# Patient Record
Sex: Female | Born: 1985 | Hispanic: Yes | Marital: Single | Smoking: Never smoker
Health system: Southern US, Community
[De-identification: ages and names within clinical notes are randomized; demographics above are authoritative.]

## PROBLEM LIST (undated history)

## (undated) DIAGNOSIS — G43909 Migraine, unspecified, not intractable, without status migrainosus: Secondary | ICD-10-CM

---

## 2020-11-22 ENCOUNTER — Encounter (HOSPITAL_COMMUNITY): Payer: Self-pay | Admitting: Emergency Medicine

## 2020-11-22 ENCOUNTER — Other Ambulatory Visit: Payer: Self-pay

## 2020-11-22 ENCOUNTER — Emergency Department (HOSPITAL_COMMUNITY)
Admission: EM | Admit: 2020-11-22 | Discharge: 2020-11-23 | Disposition: A | Discharge status: Home / Self Care | Payer: Self-pay | Attending: Emergency Medicine | Admitting: Emergency Medicine

## 2020-11-22 ENCOUNTER — Emergency Department (HOSPITAL_COMMUNITY): Payer: Self-pay

## 2020-11-22 DIAGNOSIS — M25511 Pain in right shoulder: Secondary | ICD-10-CM | POA: Insufficient documentation

## 2020-11-22 DIAGNOSIS — M25512 Pain in left shoulder: Secondary | ICD-10-CM | POA: Insufficient documentation

## 2020-11-22 DIAGNOSIS — M542 Cervicalgia: Secondary | ICD-10-CM | POA: Insufficient documentation

## 2020-11-22 DIAGNOSIS — R110 Nausea: Secondary | ICD-10-CM | POA: Insufficient documentation

## 2020-11-22 DIAGNOSIS — R519 Headache, unspecified: Secondary | ICD-10-CM | POA: Insufficient documentation

## 2020-11-22 DIAGNOSIS — R200 Anesthesia of skin: Secondary | ICD-10-CM | POA: Insufficient documentation

## 2020-11-22 HISTORY — DX: Migraine, unspecified, not intractable, without status migrainosus: G43.909

## 2020-11-22 LAB — CBC WITH DIFFERENTIAL/PLATELET
Abs Immature Granulocytes: 0.03 10*3/uL (ref 0.00–0.07)
Basophils Absolute: 0 10*3/uL (ref 0.0–0.1)
Basophils Relative: 0 %
Eosinophils Absolute: 0.1 10*3/uL (ref 0.0–0.5)
Eosinophils Relative: 1 %
HCT: 36.1 % (ref 36.0–46.0)
Hemoglobin: 12.4 g/dL (ref 12.0–15.0)
Immature Granulocytes: 0 %
Lymphocytes Relative: 28 %
Lymphs Abs: 1.9 10*3/uL (ref 0.7–4.0)
MCH: 29.7 pg (ref 26.0–34.0)
MCHC: 34.3 g/dL (ref 30.0–36.0)
MCV: 86.4 fL (ref 80.0–100.0)
Monocytes Absolute: 0.3 10*3/uL (ref 0.1–1.0)
Monocytes Relative: 5 %
Neutro Abs: 4.4 10*3/uL (ref 1.7–7.7)
Neutrophils Relative %: 66 %
Platelets: 313 10*3/uL (ref 150–400)
RBC: 4.18 MIL/uL (ref 3.87–5.11)
RDW: 13.4 % (ref 11.5–15.5)
WBC: 6.8 10*3/uL (ref 4.0–10.5)
nRBC: 0 % (ref 0.0–0.2)

## 2020-11-22 LAB — BASIC METABOLIC PANEL
Anion gap: 7 (ref 5–15)
BUN: 7 mg/dL (ref 6–20)
CO2: 24 mmol/L (ref 22–32)
Calcium: 8.4 mg/dL — ABNORMAL LOW (ref 8.9–10.3)
Chloride: 105 mmol/L (ref 98–111)
Creatinine, Ser: 0.58 mg/dL (ref 0.44–1.00)
GFR, Estimated: >60 mL/min (ref >60)
Glucose, Bld: 97 mg/dL (ref 70–99)
Potassium: 3.4 mmol/L — ABNORMAL LOW (ref 3.5–5.1)
Sodium: 136 mmol/L (ref 135–145)

## 2020-11-22 LAB — I-STAT BETA HCG BLOOD, ED (MC, WL, AP ONLY): I-stat hCG, quantitative: <5 m[IU]/mL (ref <5)

## 2020-11-22 IMAGING — CT CT ANGIO NECK
1 of 11 series · 5 of 33 positions shown · IV contrast (OMNI 350)
Comparison: None.
COMPARISON: None.

Addendum:
CLINICAL DATA: Stroke/TIA. Concern for subarachnoid hemorrhage
versus dissection.

EXAM:
CT ANGIOGRAPHY HEAD AND NECK
TECHNIQUE: Multidetector CT imaging of the head and neck was performed using
the standard protocol during bolus administration of intravenous
contrast. Multiplanar CT image reconstructions and MIPs were
obtained to evaluate the vascular anatomy. Carotid stenosis
measurements (when applicable) are obtained utilizing NASCET
criteria, using the distal internal carotid diameter as the
denominator.
CONTRAST:  75mL OMNIPAQUE IOHEXOL 350 MG/ML SOLN

[Series 11: cta neck axial · axial · 0.39mm/px · z∈[+1078,+1282]mm · 5 of 317 slices shown]
[im 53/317  soft-tissue]
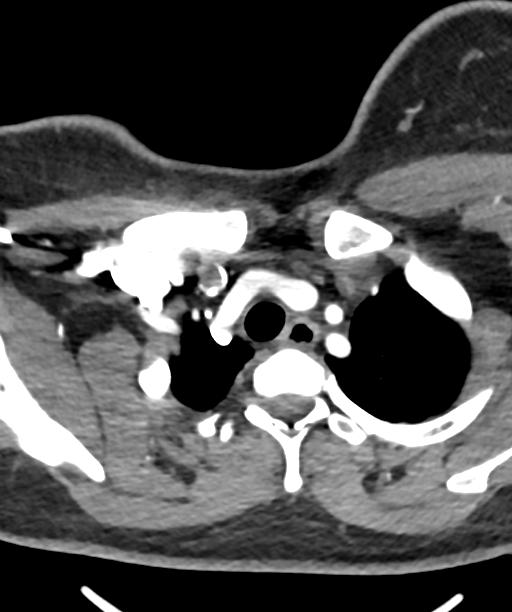
[im 106/317  bone]
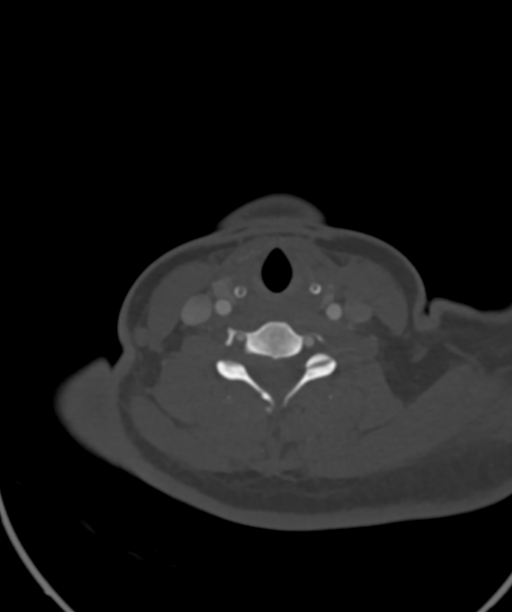
[im 159/317  soft-tissue]
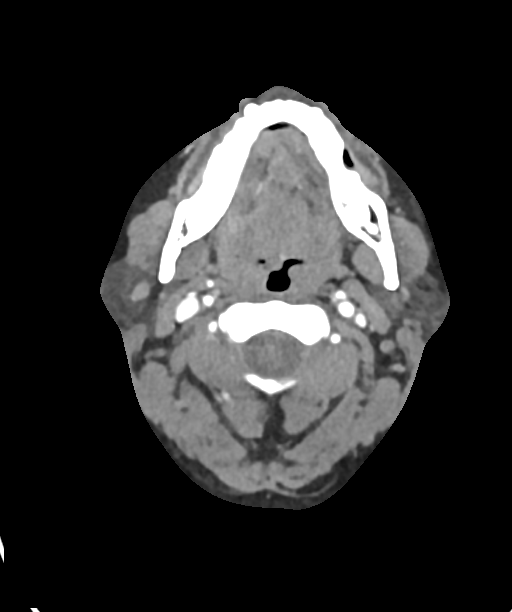
[im 211/317  bone]
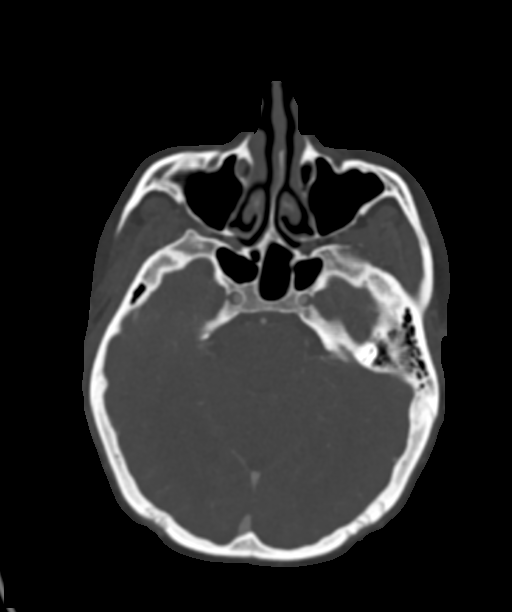
[im 264/317  soft-tissue]
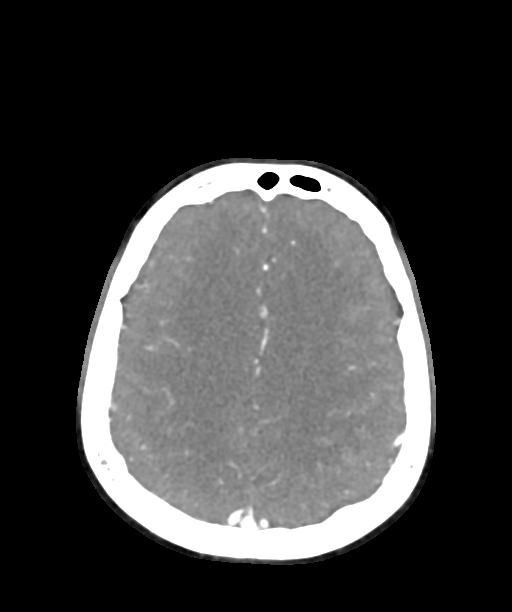

[5 of 33 positions shown; findings below may reference images not displayed]

FINDINGS: CT HEAD FINDINGS

Brain: No evidence of acute infarction, hemorrhage, hydrocephalus,
extra-axial collection or mass lesion/mass effect.

Vascular: See below.

Skull: No acute fracture. Nonspecific scalp lesion at the vertex.

Sinuses/Orbits: Mild paranasal sinus mucosal thickening. No acute
orbital findings.

Review of the MIP images confirms the above findings

CTA NECK FINDINGS

Aortic arch: Great vessel origins are patent.

Right carotid system: No evidence of dissection, stenosis (50% or
greater) or occlusion.

Left carotid system: No evidence of dissection, stenosis (50% or
greater) or occlusion.

Vertebral arteries: Codominant. No evidence of dissection, stenosis
(50% or greater) or occlusion.

Skeleton: No acute abnormality.

Other neck: No acute abnormality.

Upper chest: Mild dependent atelectasis. Otherwise, visualized lung
apices are clear.

Review of the MIP images confirms the above findings

CTA HEAD FINDINGS

Anterior circulation: Bilateral intracranial ICAs, MCAs, and ACAs
are patent without proximal flow limiting stenosis. Limited
evaluation of the distal arteries due to venous timing. Small (1-2
mm) superiorly directed outpouching rising from the right
paraophthalmic ICA (series 13, image 9) which is near ophthalmic
artery.

Posterior circulation: Mild narrowing of the proximal left
intradural vertebral artery, favored to be secondary to
atherosclerosis. Mild stenosis of the basilar artery. Mildly
dysplastic appearance of the basilar tip without focal aneurysm.
Bilateral PCAs are patent without proximal flow limiting stenosis.
Evaluation of the distal PCAs is limited due to venous
contamination.

Venous sinuses: As permitted by contrast timing, patent. Arachnoid
granulation in the distal left transverse sinus. Small left
transverse and sigmoid sinuses.

Review of the MIP images confirms the above findings
IMPRESSION: CT head:

No evidence of acute intracranial abnormality.

CTA head:

1. No large vessel occlusion.
2. Mild stenosis of the proximal intradural left vertebral artery
and the basilar artery, most likely secondary to atherosclerosis.
3. Small (1-2 mm) superiorly directed outpouching rising from the
right paraopthalmic ICA, which is near the ophthalmic artery and may
represent an infundibulum versus small aneurysm.

CTA Neck:

No significant (greater than 50%) stenosis or specific evidence of
dissection.

ADDENDUM:
On further review, there is luminal irregularity of the proximal
right vertebral artery (see series 13, image 84; series 11, image
241) with approximately 40-50% stenosis. Given the patient's
reported acute neck pain, this finding may represent a small focal
dissection.

Findings discussed with AKUTO, PA at [DATE] via telephone.

*** End of Addendum ***
FINDINGS: CT HEAD FINDINGS

Brain: No evidence of acute infarction, hemorrhage, hydrocephalus,
extra-axial collection or mass lesion/mass effect.

Vascular: See below.

Skull: No acute fracture. Nonspecific scalp lesion at the vertex.

Sinuses/Orbits: Mild paranasal sinus mucosal thickening. No acute
orbital findings.

Review of the MIP images confirms the above findings

CTA NECK FINDINGS

Aortic arch: Great vessel origins are patent.

Right carotid system: No evidence of dissection, stenosis (50% or
greater) or occlusion.

Left carotid system: No evidence of dissection, stenosis (50% or
greater) or occlusion.

Vertebral arteries: Codominant. No evidence of dissection, stenosis
(50% or greater) or occlusion.

Skeleton: No acute abnormality.

Other neck: No acute abnormality.

Upper chest: Mild dependent atelectasis. Otherwise, visualized lung
apices are clear.

Review of the MIP images confirms the above findings

CTA HEAD FINDINGS

Anterior circulation: Bilateral intracranial ICAs, MCAs, and ACAs
are patent without proximal flow limiting stenosis. Limited
evaluation of the distal arteries due to venous timing. Small (1-2
mm) superiorly directed outpouching rising from the right
paraophthalmic ICA (series 13, image 9) which is near ophthalmic
artery.

Posterior circulation: Mild narrowing of the proximal left
intradural vertebral artery, favored to be secondary to
atherosclerosis. Mild stenosis of the basilar artery. Mildly
dysplastic appearance of the basilar tip without focal aneurysm.
Bilateral PCAs are patent without proximal flow limiting stenosis.
Evaluation of the distal PCAs is limited due to venous
contamination.

Venous sinuses: As permitted by contrast timing, patent. Arachnoid
granulation in the distal left transverse sinus. Small left
transverse and sigmoid sinuses.

Review of the MIP images confirms the above findings
IMPRESSION: CT head:

No evidence of acute intracranial abnormality.

CTA head:

1. No large vessel occlusion.
2. Mild stenosis of the proximal intradural left vertebral artery
and the basilar artery, most likely secondary to atherosclerosis.
3. Small (1-2 mm) superiorly directed outpouching rising from the
right paraopthalmic ICA, which is near the ophthalmic artery and may
represent an infundibulum versus small aneurysm.

CTA Neck:

No significant (greater than 50%) stenosis or specific evidence of
dissection.

## 2020-11-22 MED ORDER — IOHEXOL 350 MG/ML SOLN
75.0000 mL | Freq: Once | INTRAVENOUS | Status: AC | PRN
  Administered 2020-11-22: 75 mL via INTRAVENOUS

## 2020-11-22 MED ORDER — ASPIRIN EC 81 MG PO TBEC
81.0000 mg | DELAYED_RELEASE_TABLET | Freq: Once | ORAL | Status: AC
  Administered 2020-11-22: 81 mg via ORAL
  Filled 2020-11-22: qty 1

## 2020-11-22 MED ORDER — KETOROLAC TROMETHAMINE 15 MG/ML IJ SOLN
15.0000 mg | Freq: Once | INTRAMUSCULAR | Status: AC
  Administered 2020-11-22: 15 mg via INTRAVENOUS
  Filled 2020-11-22: qty 1

## 2020-11-22 MED ORDER — DIPHENHYDRAMINE HCL 50 MG/ML IJ SOLN
50.0000 mg | Freq: Once | INTRAMUSCULAR | Status: AC
  Administered 2020-11-22: 50 mg via INTRAVENOUS
  Filled 2020-11-22: qty 1

## 2020-11-22 MED ORDER — SODIUM CHLORIDE 0.9 % IV BOLUS
1000.0000 mL | Freq: Once | INTRAVENOUS | Status: AC
  Administered 2020-11-22: 1000 mL via INTRAVENOUS

## 2020-11-22 MED ORDER — METOCLOPRAMIDE HCL 5 MG/ML IJ SOLN
10.0000 mg | Freq: Once | INTRAMUSCULAR | Status: AC
  Administered 2020-11-22: 10 mg via INTRAVENOUS
  Filled 2020-11-22: qty 2

## 2020-11-22 MED ORDER — DEXAMETHASONE SODIUM PHOSPHATE 10 MG/ML IJ SOLN
10.0000 mg | Freq: Once | INTRAMUSCULAR | Status: AC
  Administered 2020-11-22: 10 mg via INTRAVENOUS
  Filled 2020-11-22: qty 1

## 2020-11-22 MED ORDER — CLOPIDOGREL BISULFATE 75 MG PO TABS
75.0000 mg | ORAL_TABLET | Freq: Once | ORAL | Status: AC
  Administered 2020-11-22: 75 mg via ORAL
  Filled 2020-11-22: qty 1

## 2020-11-22 NOTE — ED Notes (Signed)
Patient transported to CT 

## 2020-11-22 NOTE — ED Notes (Signed)
Pt ambulated to the restroom w/o incident. 

## 2020-11-22 NOTE — ED Triage Notes (Signed)
C/o L sided headache with shooting pain to R side of head, neck pain, nausea, and vomiting since Monday.  Pain worse for 2 days.  History of migraines but states this feels different.  Stratus used for triage.

## 2020-11-22 NOTE — ED Provider Notes (Signed)
Care of the patient received from [facility].  Please see his note for full HPI.  In short, 35 year old female with a history of migraines presented with left-sided headache radiating into her neck, onset gradual in nature, progressively worsening throughout the week.  Unrelieved with Tylenol.  She had endorsed some numbness to the hands bilaterally as well.  No focal neurodeficits on prior provider's exam, as well as my exam.  There was concern for possible dissection and thus a CTA of the head and neck was ordered.  Signout received pending imaging.   Patient had received a migraine cocktail from prior provider.  On my reevaluation, she states it has slightly improved but she still continues to have a headache.  Added on Toradol and Decadron.  CTA of the head and neck with small outpouching from the right paraophthalmic ICA which could be an infundibulum or a small aneurysm.  CTA of the neck was overall reassuring.  However at 10:03 PM, did receive a call from radiologist Dr. Mervyn Skeeters who had reviewed the patient's imaging again, and did note an irregularity of the proximal right vertebral artery with approximately 40 to 50% stenosis.  There was concern for small focal dissection.  I discussed the case with Dr. Derry Lory with neurology.  He recommends MRI of the brain without contrast as well as MR V and MRA to rule out.  He also recommends aspirin and Plavix which was ordered.  Signed out care to [facility] who will oversee her imaging and dispo accordingly   Case discussed w/ Dr. Fredderick Phenix who is agreeable to the above plan and disposition   Results for orders placed or performed during the hospital encounter of 11/22/20  Basic metabolic panel  Result Value Ref Range   Sodium 136 135 - 145 mmol/L   Potassium 3.4 (L) 3.5 - 5.1 mmol/L   Chloride 105 98 - 111 mmol/L   CO2 24 22 - 32 mmol/L   Glucose, Bld 97 70 - 99 mg/dL   BUN 7 6 - 20 mg/dL   Creatinine, Ser 7.09 0.44 - 1.00  mg/dL   Calcium 8.4 (L) 8.9 - 10.3 mg/dL   GFR, Estimated >62 >83 mL/min   Anion gap 7 5 - 15  CBC with Differential  Result Value Ref Range   WBC 6.8 4.0 - 10.5 K/uL   RBC 4.18 3.87 - 5.11 MIL/uL   Hemoglobin 12.4 12.0 - 15.0 g/dL   HCT 66.2 94.7 - 65.4 %   MCV 86.4 80.0 - 100.0 fL   MCH 29.7 26.0 - 34.0 pg   MCHC 34.3 30.0 - 36.0 g/dL   RDW 65.0 35.4 - 65.6 %   Platelets 313 150 - 400 K/uL   nRBC 0.0 0.0 - 0.2 %   Neutrophils Relative % 66 %   Neutro Abs 4.4 1.7 - 7.7 K/uL   Lymphocytes Relative 28 %   Lymphs Abs 1.9 0.7 - 4.0 K/uL   Monocytes Relative 5 %   Monocytes Absolute 0.3 0.1 - 1.0 K/uL   Eosinophils Relative 1 %   Eosinophils Absolute 0.1 0.0 - 0.5 K/uL   Basophils Relative 0 %   Basophils Absolute 0.0 0.0 - 0.1 K/uL   Immature Granulocytes 0 %   Abs Immature Granulocytes 0.03 0.00 - 0.07 K/uL  I-Stat Beta hCG blood, ED ([facility], [facility], [facility] only)  Result Value Ref Range   I-stat hCG, quantitative <5.0 <5 mIU/mL   Comment 3  CT Angio Head W or Wo Contrast  Addendum Date: 11/22/2020   ADDENDUM REPORT: 11/22/2020 22:08 ADDENDUM: On further review, there is luminal irregularity of the proximal right vertebral artery (see series 13, image 84; series 11, image 241) with approximately 40-50% stenosis. Given the patient's reported acute neck pain, this finding may represent a small focal dissection. Findings discussed with Trudee Grip, PA at 10:03 PM via telephone. Electronically Signed   By: Feliberto Harts MD   On: 11/22/2020 22:08   Result Date: 11/22/2020 CLINICAL DATA:  Stroke/TIA. Concern for subarachnoid hemorrhage versus dissection. EXAM: CT ANGIOGRAPHY HEAD AND NECK TECHNIQUE: Multidetector CT imaging of the head and neck was performed using the standard protocol during bolus administration of intravenous contrast. Multiplanar CT image reconstructions and MIPs were obtained to evaluate the vascular anatomy. Carotid stenosis measurements (when applicable) are  obtained utilizing NASCET criteria, using the distal internal carotid diameter as the denominator. CONTRAST:  76mL OMNIPAQUE IOHEXOL 350 MG/ML SOLN COMPARISON:  None. FINDINGS: CT HEAD FINDINGS Brain: No evidence of acute infarction, hemorrhage, hydrocephalus, extra-axial collection or mass lesion/mass effect. Vascular: See below. Skull: No acute fracture. Nonspecific scalp lesion at the vertex. Sinuses/Orbits: Mild paranasal sinus mucosal thickening. No acute orbital findings. Review of the MIP images confirms the above findings CTA NECK FINDINGS Aortic arch: Great vessel origins are patent. Right carotid system: No evidence of dissection, stenosis (50% or greater) or occlusion. Left carotid system: No evidence of dissection, stenosis (50% or greater) or occlusion. Vertebral arteries: Codominant. No evidence of dissection, stenosis (50% or greater) or occlusion. Skeleton: No acute abnormality. Other neck: No acute abnormality. Upper chest: Mild dependent atelectasis. Otherwise, visualized lung apices are clear. Review of the MIP images confirms the above findings CTA HEAD FINDINGS Anterior circulation: Bilateral intracranial ICAs, MCAs, and ACAs are patent without proximal flow limiting stenosis. Limited evaluation of the distal arteries due to venous timing. Small (1-2 mm) superiorly directed outpouching rising from the right paraophthalmic ICA (series 13, image 9) which is near ophthalmic artery. Posterior circulation: Mild narrowing of the proximal left intradural vertebral artery, favored to be secondary to atherosclerosis. Mild stenosis of the basilar artery. Mildly dysplastic appearance of the basilar tip without focal aneurysm. Bilateral PCAs are patent without proximal flow limiting stenosis. Evaluation of the distal PCAs is limited due to venous contamination. Venous sinuses: As permitted by contrast timing, patent. Arachnoid granulation in the distal left transverse sinus. Small left transverse and  sigmoid sinuses. Review of the MIP images confirms the above findings IMPRESSION: CT head: No evidence of acute intracranial abnormality. CTA head: 1. No large vessel occlusion. 2. 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 3. Small (1-2 mm) superiorly directed outpouching rising from the right paraopthalmic ICA, which is near the ophthalmic artery and may represent an infundibulum versus small aneurysm. CTA Neck: No significant (greater than 50%) stenosis or specific evidence of dissection. Electronically Signed: By: Feliberto Harts MD On: 11/22/2020 20:02   CT Angio Neck W and/or Wo Contrast  Addendum Date: 11/22/2020   ADDENDUM REPORT: 11/22/2020 22:08 ADDENDUM: On further review, there is luminal irregularity of the proximal right vertebral artery (see series 13, image 84; series 11, image 241) with approximately 40-50% stenosis. Given the patient's reported acute neck pain, this finding may represent a small focal dissection. Findings discussed with Trudee Grip, PA at 10:03 PM via telephone. Electronically Signed   By: Feliberto Harts MD  On: 11/22/2020 22:08   Result Date: 11/22/2020 CLINICAL DATA:  Stroke/TIA. Concern for subarachnoid hemorrhage versus dissection. EXAM: CT ANGIOGRAPHY HEAD AND NECK TECHNIQUE: Multidetector CT imaging of the head and neck was performed using the standard protocol during bolus administration of intravenous contrast. Multiplanar CT image reconstructions and MIPs were obtained to evaluate the vascular anatomy. Carotid stenosis measurements (when applicable) are obtained utilizing NASCET criteria, using the distal internal carotid diameter as the denominator. CONTRAST:  67mL OMNIPAQUE IOHEXOL 350 MG/ML SOLN COMPARISON:  None. FINDINGS: CT HEAD FINDINGS Brain: No evidence of acute infarction, hemorrhage, hydrocephalus, extra-axial collection or mass lesion/mass effect. Vascular: See  below. Skull: No acute fracture. Nonspecific scalp lesion at the vertex. Sinuses/Orbits: Mild paranasal sinus mucosal thickening. No acute orbital findings. Review of the MIP images confirms the above findings CTA NECK FINDINGS Aortic arch: Great vessel origins are patent. Right carotid system: No evidence of dissection, stenosis (50% or greater) or occlusion. Left carotid system: No evidence of dissection, stenosis (50% or greater) or occlusion. Vertebral arteries: Codominant. No evidence of dissection, stenosis (50% or greater) or occlusion. Skeleton: No acute abnormality. Other neck: No acute abnormality. Upper chest: Mild dependent atelectasis. Otherwise, visualized lung apices are clear. Review of the MIP images confirms the above findings CTA HEAD FINDINGS Anterior circulation: Bilateral intracranial ICAs, MCAs, and ACAs are patent without proximal flow limiting stenosis. Limited evaluation of the distal arteries due to venous timing. Small (1-2 mm) superiorly directed outpouching rising from the right paraophthalmic ICA (series 13, image 9) which is near ophthalmic artery. Posterior circulation: Mild narrowing of the proximal left intradural vertebral artery, favored to be secondary to atherosclerosis. Mild stenosis of the basilar artery. Mildly dysplastic appearance of the basilar tip without focal aneurysm. Bilateral PCAs are patent without proximal flow limiting stenosis. Evaluation of the distal PCAs is limited due to venous contamination. Venous sinuses: As permitted by contrast timing, patent. Arachnoid granulation in the distal left transverse sinus. Small left transverse and sigmoid sinuses. Review of the MIP images confirms the above findings IMPRESSION: CT head: No evidence of acute intracranial abnormality. CTA head: 1. No large vessel occlusion. 2. 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 3. Small (1-2 mm)  superiorly directed outpouching rising from the right paraopthalmic ICA, which is near the ophthalmic artery and may represent an infundibulum versus small aneurysm. CTA Neck: No significant (greater than 50%) stenosis or specific evidence of dissection. Electronically Signed: By: Feliberto Harts MD On: 11/22/2020 20:02      Mare Ferrari, PA-C 11/22/20 2316    Rolan Bucco, MD 11/22/20 2332

## 2020-11-22 NOTE — ED Provider Notes (Signed)
[facility] EMERGENCY DEPARTMENT Provider Note   CSN: [id number] Arrival date & time: 11/22/20  1336     History Chief Complaint  Patient presents with   Headache    Joy Moore is a 35 y.o. female with a reported history of migraine headaches.  Patient presents emergency department with complaint of headache.  Patient reports that her headache started on Monday.  Headache was intermittent.  Pain became constant and more intense.  Pain is located to the left side of her head and radiates into the left side of her neck.  Patient reports the pain is worse when straining, standing, or bending.  Patient reports no relief with Tylenol.  Patient rates pain 10/10 on the pain scale.  Patient reports that this pain is different from previous migraine headaches he has had.  Patient endorses nausea last night and this morning however none at present.  Patient endorses numbness to bilateral hands.   Patient denies any fevers, chills, visual disturbance, vomiting, neck stiffness, weakness, facial asymmetry, slurred speech.    Patient denies any recent falls or injury.  Patient denies any illicit drug use.    Interpreter was used to conduct this interview.   Headache Associated symptoms: nausea, neck pain and numbness   Associated symptoms: no abdominal pain, no back pain, no dizziness, no fever, no neck stiffness, no seizures, no vomiting and no weakness       Past Medical History:  Diagnosis Date   Migraine     There are no problems to display for this patient.   History reviewed. No pertinent surgical history.   OB History   No obstetric history on file.     No family history on file.  Social History   Tobacco Use   Smoking status: Never   Smokeless tobacco: Never  Substance Use Topics   Alcohol use: Never   Drug use: Never    Home Medications Prior to Admission medications   Not on File    Allergies    Patient has no allergy information on  record.  Review of Systems   Review of Systems  Constitutional:  Negative for chills and fever.  HENT:  Negative for facial swelling.   Eyes:  Negative for visual disturbance.  Respiratory:  Negative for shortness of breath.   Cardiovascular:  Negative for chest pain.  Gastrointestinal:  Positive for nausea. Negative for abdominal pain and vomiting.  Genitourinary:  Negative for difficulty urinating.  Musculoskeletal:  Positive for neck pain. Negative for back pain and neck stiffness.  Skin:  Negative for color change and rash.  Neurological:  Positive for numbness and headaches. Negative for dizziness, tremors, seizures, syncope, facial asymmetry, speech difficulty, weakness and light-headedness.  Psychiatric/Behavioral:  Negative for confusion.    Physical Exam Updated Vital Signs BP (!) 151/95 (BP Location: Left Arm)   Pulse 84   Temp 98.7 F (37.1 C) (Oral)   Resp 18   LMP 10/25/2020   SpO2 100%   Physical Exam Vitals and nursing note reviewed.  Constitutional:      General: She is not in acute distress.    Appearance: She is not ill-appearing, toxic-appearing or diaphoretic.     Comments: Appears uncomfortable d/t complaints of pain   HENT:     Head: Normocephalic and atraumatic. No raccoon eyes, Battle's sign, abrasion, contusion, masses, right periorbital erythema, left periorbital erythema or laceration.     Jaw: No trismus or pain on movement.  Mouth/Throat:     Pharynx: Oropharynx is clear. Uvula midline. No pharyngeal swelling, oropharyngeal exudate, posterior oropharyngeal erythema or uvula swelling.  Eyes:     General: No scleral icterus.       Right eye: No discharge.        Left eye: No discharge.     Extraocular Movements: Extraocular movements intact.     Conjunctiva/sclera: Conjunctivae normal.     Pupils: Pupils are equal, round, and reactive to light.  Neck:     Comments: Tenderness to left sternocleidomastoid And bilateral trapezius muscles.    Cardiovascular:     Rate and Rhythm: Normal rate.  Pulmonary:     Effort: Pulmonary effort is normal. No respiratory distress.  Abdominal:     Palpations: Abdomen is soft.     Tenderness: There is no abdominal tenderness.  Musculoskeletal:     Cervical back: Neck supple. No edema, erythema, signs of trauma, rigidity, torticollis or crepitus. Muscular tenderness present. No pain with movement or spinous process tenderness. Decreased range of motion (2ndary d/t to complaints of pain).     Right lower leg: No swelling, tenderness or bony tenderness. No edema.     Left lower leg: No swelling or tenderness. No edema.  Skin:    General: Skin is warm and dry.     Coloration: Skin is not cyanotic or pale.  Neurological:     General: No focal deficit present.     Mental Status: She is alert.     GCS: GCS eye subscore is 4. GCS verbal subscore is 5. GCS motor subscore is 6.     Cranial Nerves: No cranial nerve deficit or facial asymmetry.     Sensory: Sensation is intact.     Motor: No weakness, tremor, seizure activity or pronator drift.     Coordination: Finger-Nose-Finger Test normal.     Gait: Gait is intact.     Comments: CN II-XII intact, equal grip strength, +5 strength to bilateral upper and lower extremities, sensation to light touch intact to bilateral upper and lower extremities  Psychiatric:        Behavior: Behavior is cooperative.    ED Results / Procedures / Treatments   Labs (all labs ordered are listed, but only abnormal results are displayed) Labs Reviewed  BASIC METABOLIC PANEL  CBC WITH DIFFERENTIAL/PLATELET  I-STAT BETA HCG BLOOD, ED ([facility], [facility], [facility] ONLY)    EKG None  Radiology No results found.  Procedures Procedures   Medications Ordered in ED Medications  metoCLOPramide (REGLAN) injection 10 mg (has no administration in time range)  diphenhydrAMINE (BENADRYL) injection 50 mg (has no administration in time range)  sodium chloride 0.9 % bolus 1,000 mL (has  no administration in time range)    ED Course  I have reviewed the triage vital signs and the nursing notes.  Pertinent labs & imaging results that were available during my care of the patient were reviewed by me and considered in my medical decision making (see chart for details).    MDM Rules/Calculators/A&P                          Alert 35 year old female, appears uncomfortable to report the pain, in no acute distress.  Presents emerged part with a chief complaint of headache.  Headache associated left side of her head and radiates into the left side of her neck.  Patient endorses numbness to bilateral hands.  On physical exam patient has  no focal neurological deficits.  Patient does have tenderness to left sternocleidomastoid muscle and bilateral trapezius muscles.  Pain with neck range of motion.  Decreased cervical range of motion due to complaints of pain.  Will obtain CTA head and neck to evaluate for subarachnoid hemorrhage or dissection.  We will give patient migraine cocktail.  Patient care transferred to PA Belaya at the end of my shift. Patient presentation, ED course, and plan of care discussed with review of all pertinent labs and imaging. Please see his/her note for further details regarding further ED course and disposition.   Final Clinical Impression(s) / ED Diagnoses Final diagnoses:  None    Rx / DC Orders ED Discharge Orders     None        Haskel Schroeder, PA-C 11/22/20 1722    Horton, Clabe Seal, DO 11/26/20 1343

## 2020-11-23 ENCOUNTER — Emergency Department (HOSPITAL_COMMUNITY): Payer: Self-pay

## 2020-11-23 IMAGING — MR MR MRV HEAD WO/W CM
2 series · 16 of 16 positions shown · IV contrast (Yes GAD)
Comparison: Prior brain MRI from earlier the same day as well as
CTA from [DATE].

CLINICAL DATA: Initial evaluation for acute headache.

EXAM:
MR VENOGRAM HEAD WITHOUT AND WITH CONTRAST
TECHNIQUE: Angiographic images of the intracranial venous structures were
acquired using MRV technique without and with intravenous contrast.
CONTRAST:  7mL GADAVIST GADOBUTROL 1 MMOL/ML IV SOLN

[Series 1100: multiplanar reconstruction (mpr) · sagittal · 0.9mm · 0.47mm/px · 7 of 204 slices shown (1 of 2)]
[im 1/204]
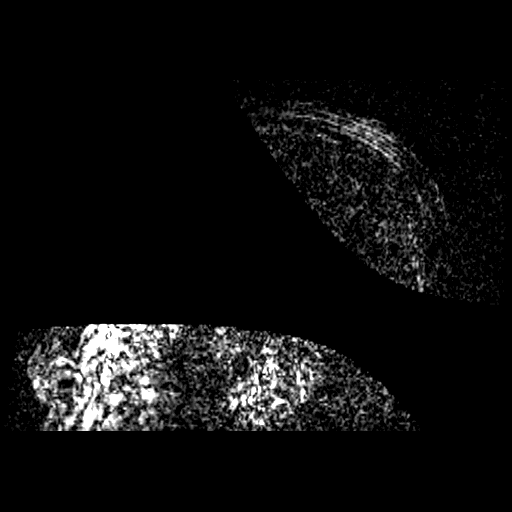
[im 34/204]
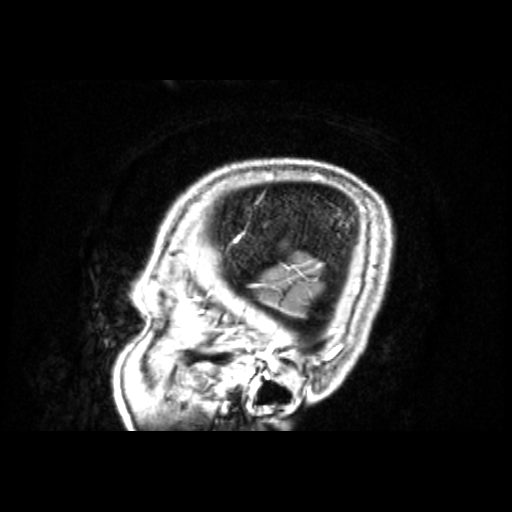
[im 68/204]
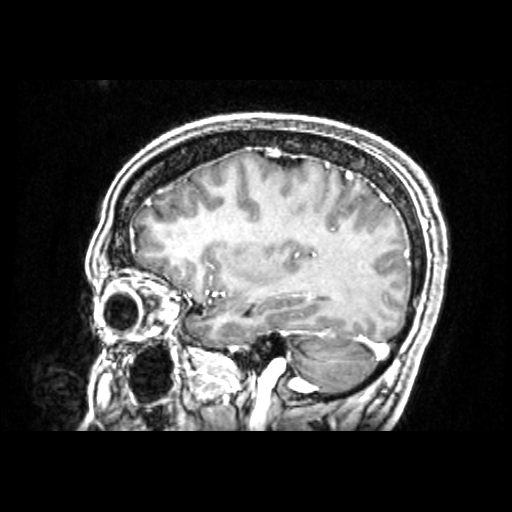
[im 102/204]
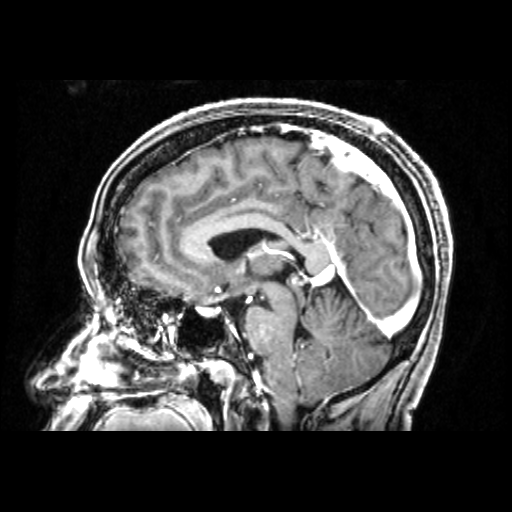
[im 136/204]
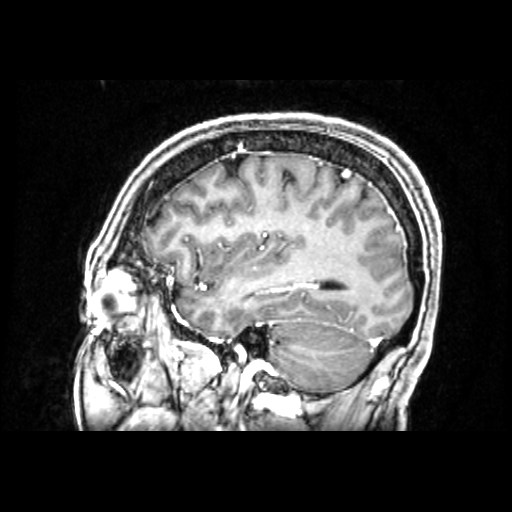
[im 170/204]
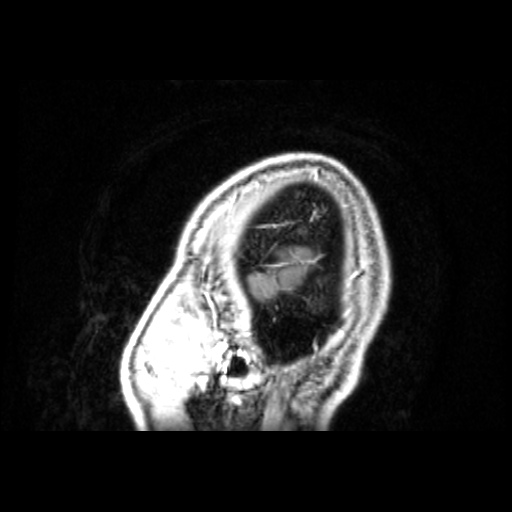
[im 204/204]
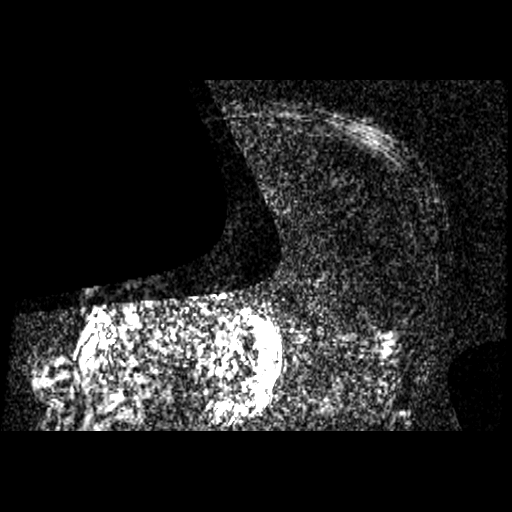

[Series 1101: multiplanar reconstruction (mpr) · coronal · 0.9mm · 0.47mm/px · 9 of 255 slices shown (2 of 2)]
[im 1/255]
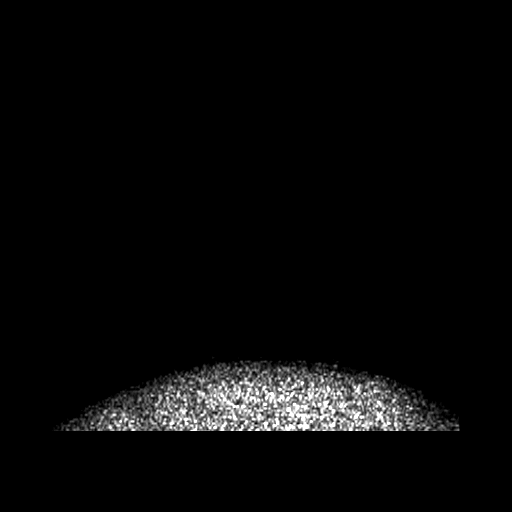
[im 32/255]
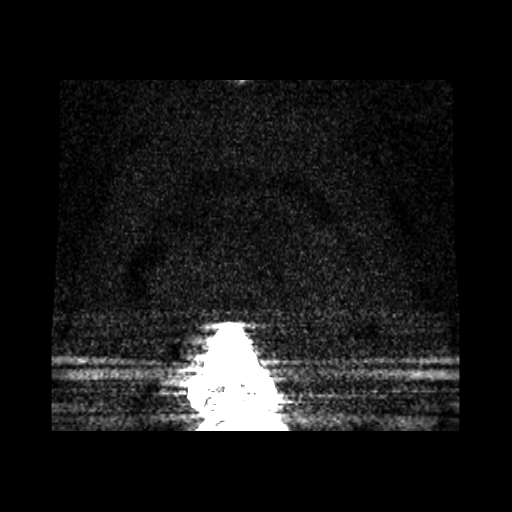
[im 64/255]
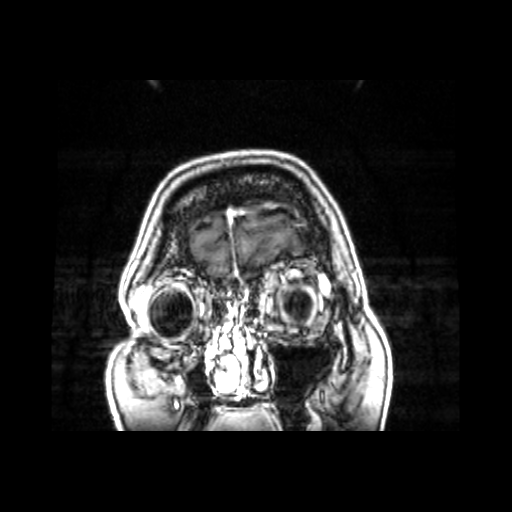
[im 96/255]
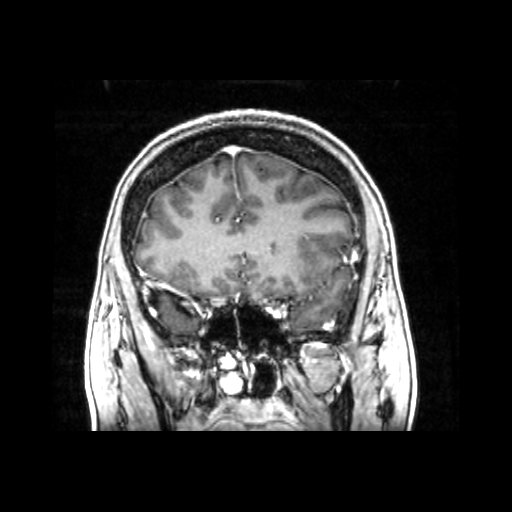
[im 128/255]
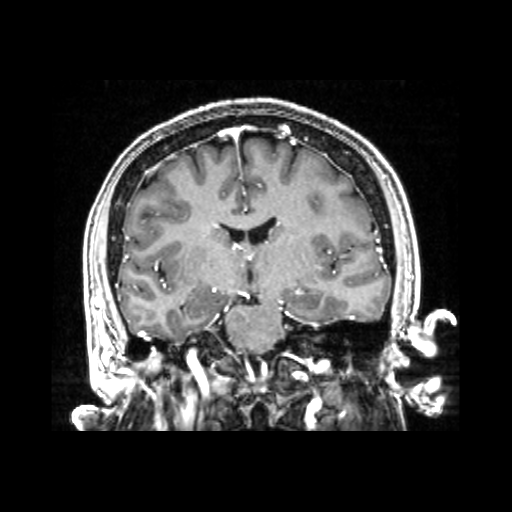
[im 159/255]
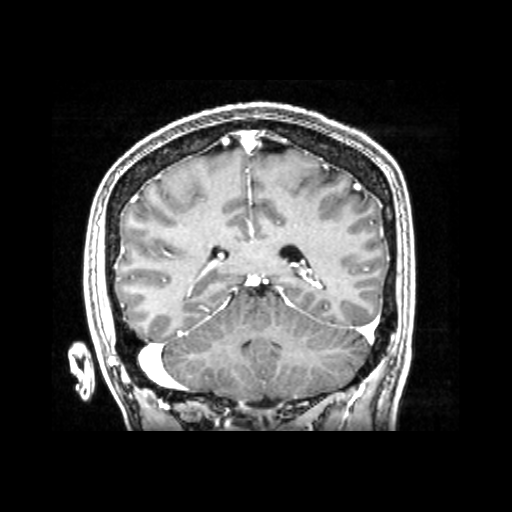
[im 191/255]
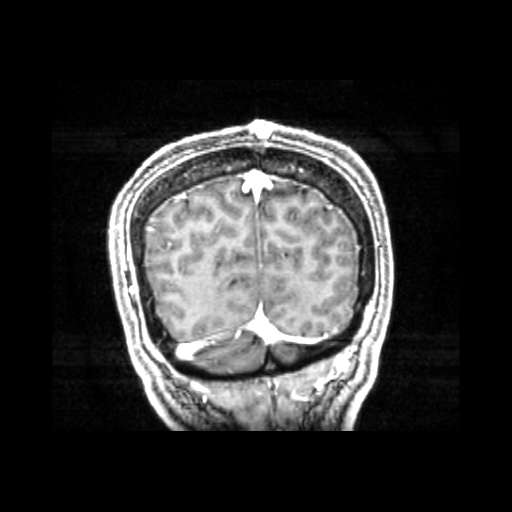
[im 223/255]
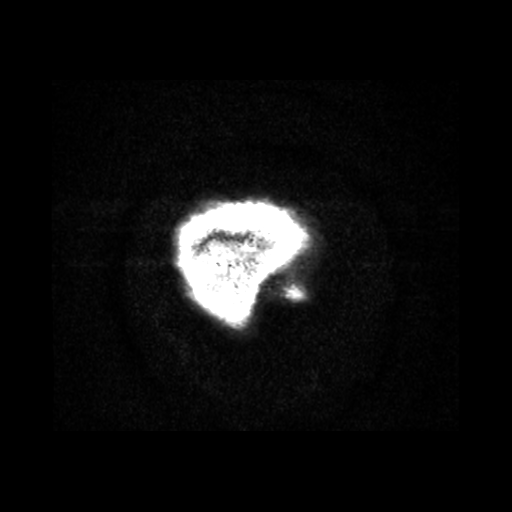
[im 255/255]
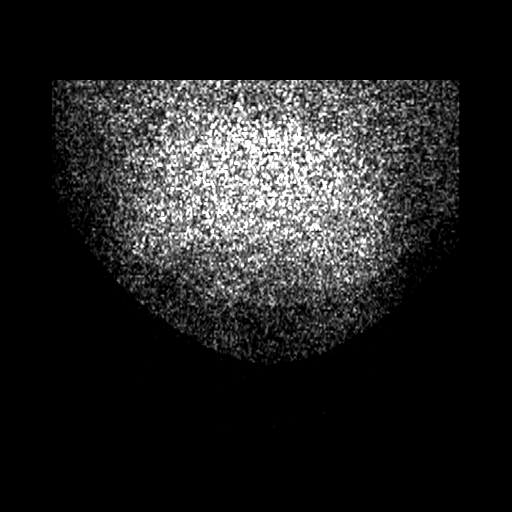

[16 of 16 positions shown; findings below may reference images not displayed]

FINDINGS: Normal flow related signal and enhancement seen throughout the
superior sagittal sinus to the level of the torcula. Transverse and
sigmoid sinuses are patent as are the visualized proximal internal
jugular veins. Right transverse sinus dominant. Straight sinus, vein
of JACKELIN, internal cerebral veins, and basal veins of JACKELIN
appear patent. No abnormality seen about the cavernous sinus.
Superior orbital veins are symmetric and within normal limits. No
appreciable cortical vein thrombosis.

No other appreciable pathologic enhancement within the brain. 1 cm
nonspecific lesion at the posterior scalp vertex again noted.
IMPRESSION: Negative intracranial MRV.  No evidence for dural sinus thrombosis.

## 2020-11-23 IMAGING — MR MR HEAD W/O CM
6 of 11 series · 24 of 48 positions shown · non-contrast
Comparison: Prior CT from [DATE].

CLINICAL DATA: Initial evaluation for acute headache.

EXAM:
MRI HEAD WITHOUT CONTRAST
TECHNIQUE: Multiplanar, multiecho pulse sequences of the brain and surrounding
structures were obtained without intravenous contrast.

[Series 2: DWI · axial · 3.0mm · 0.94mm/px · z∈[-105,+33]mm · 7 of 100 slices shown (1 of 2)]
[im 1/100]
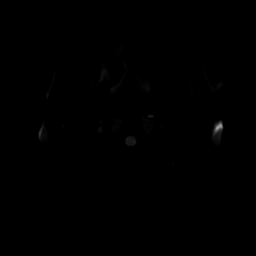
[im 17/100]
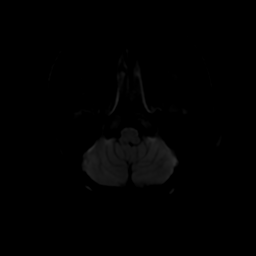
[im 34/100]
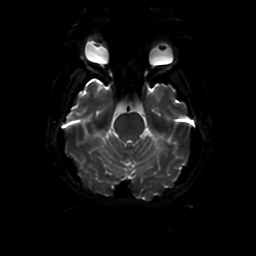
[im 50/100]
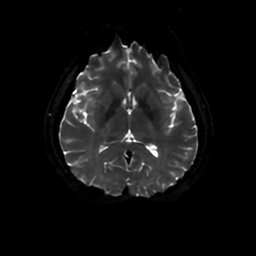
[im 67/100]
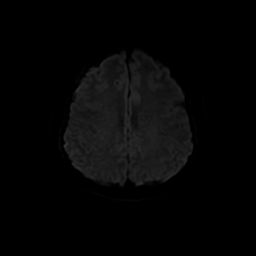
[im 83/100]
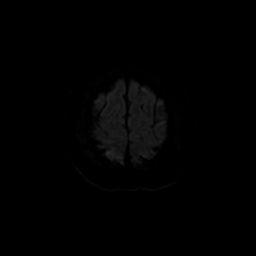
[im 100/100]
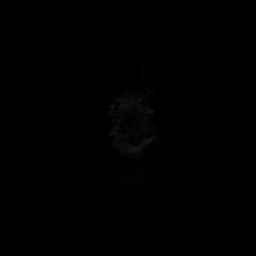

[Series 3: DWI · coronal · 4.0mm · 0.94mm/px · 5 of 67 slices shown (2 of 2)]
[im 1/67]
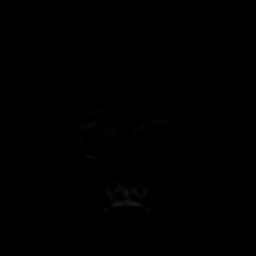
[im 17/67]
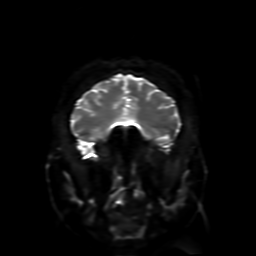
[im 34/67]
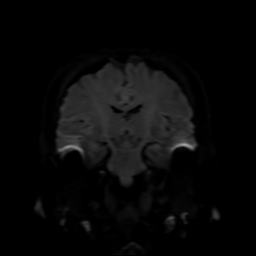
[im 50/67]
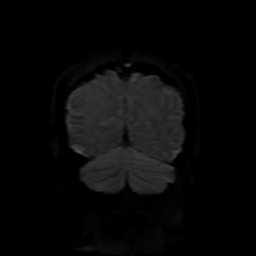
[im 67/67]
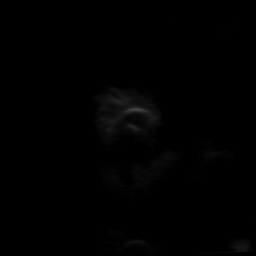

[Series 4: FLAIR · sagittal · 5.0mm · 0.23mm/px · 2 of 27 slices shown (1 of 2)]
[im 1/27]
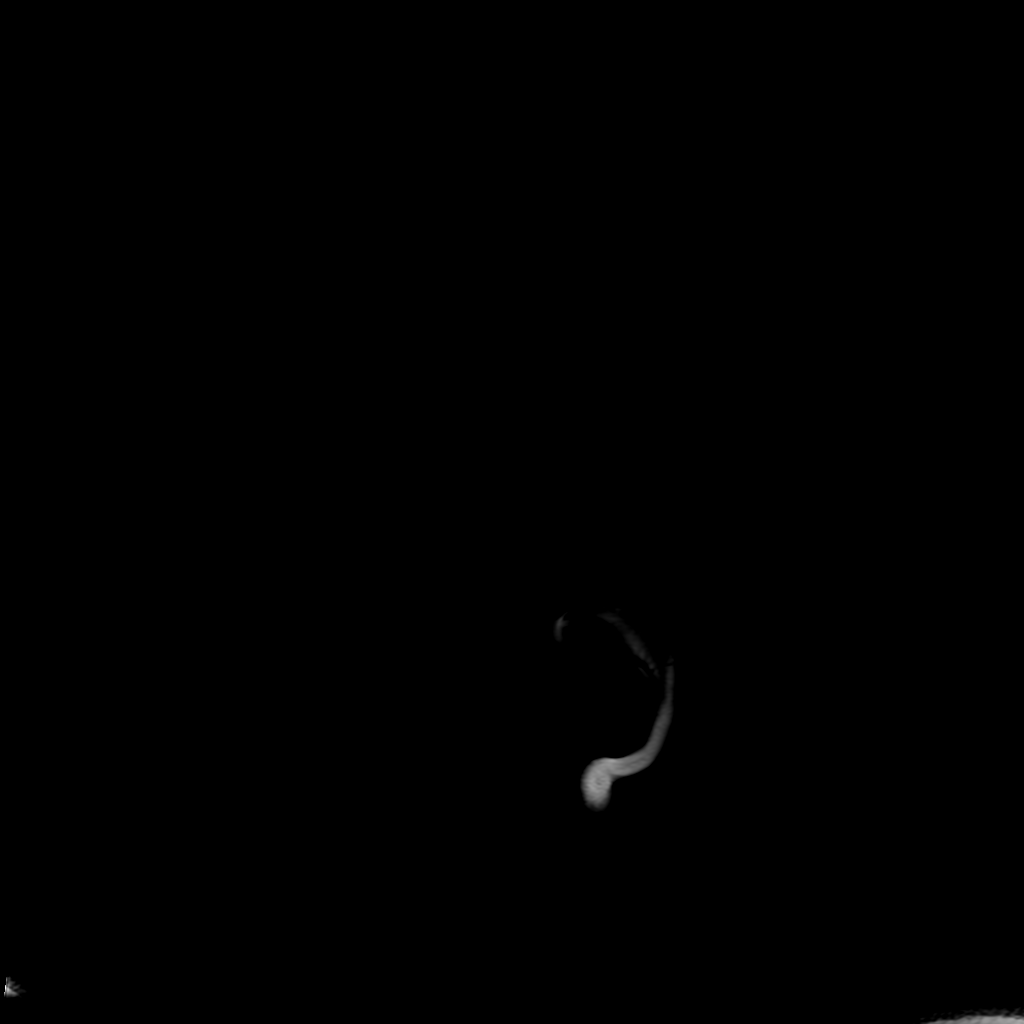
[im 27/27]
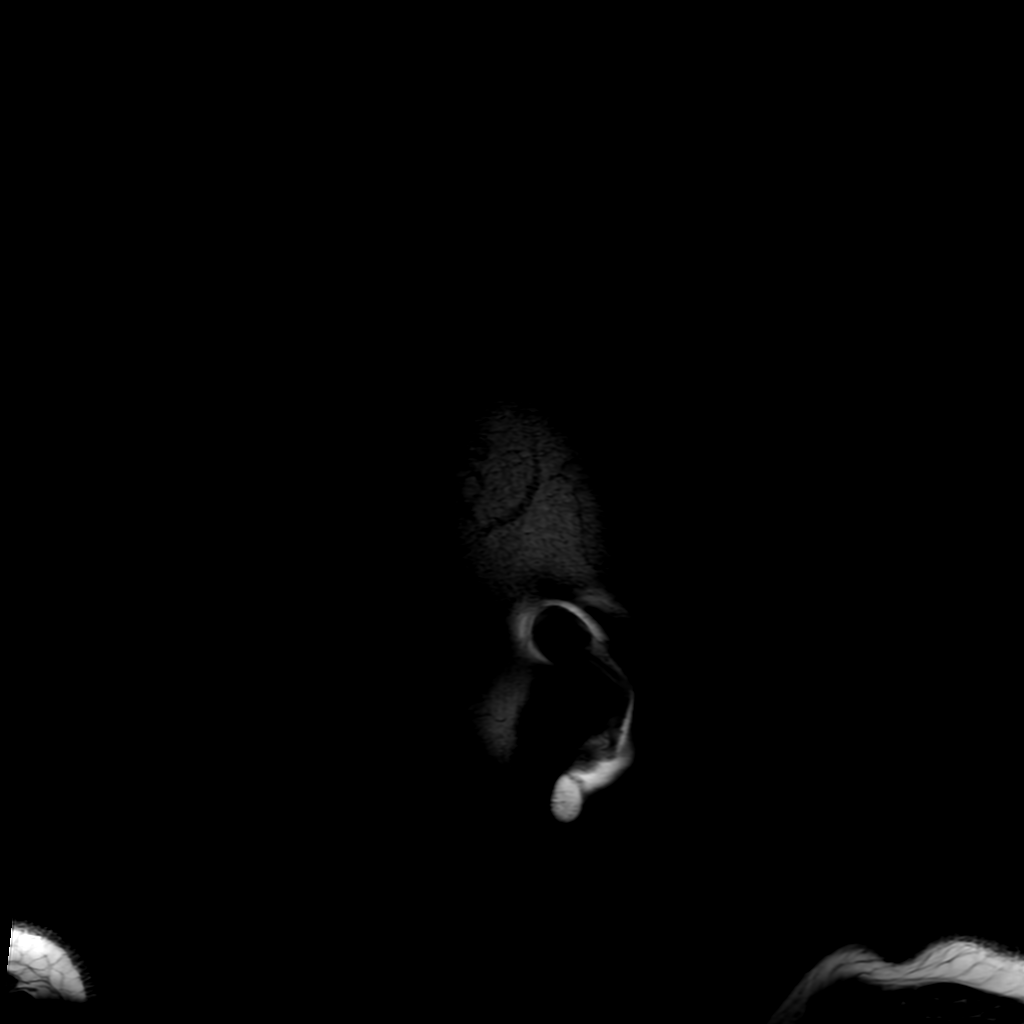

[Series 6: FLAIR · axial · 4.0mm · 0.45mm/px · z∈[-105,+33]mm · 3 of 35 slices shown (2 of 2)]
[im 1/35]
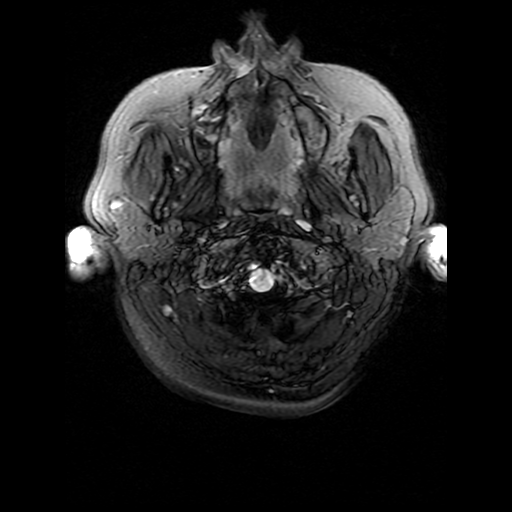
[im 18/35]
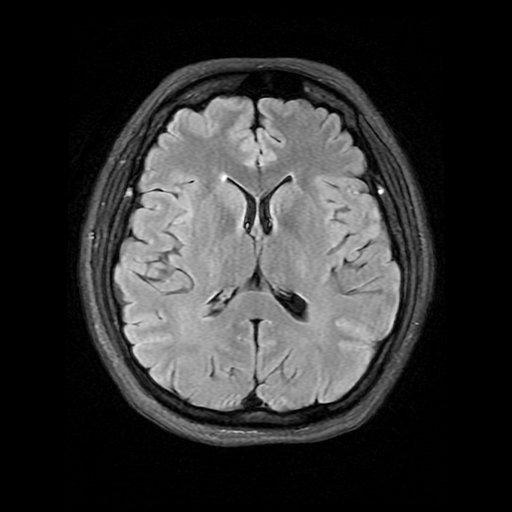
[im 35/35]
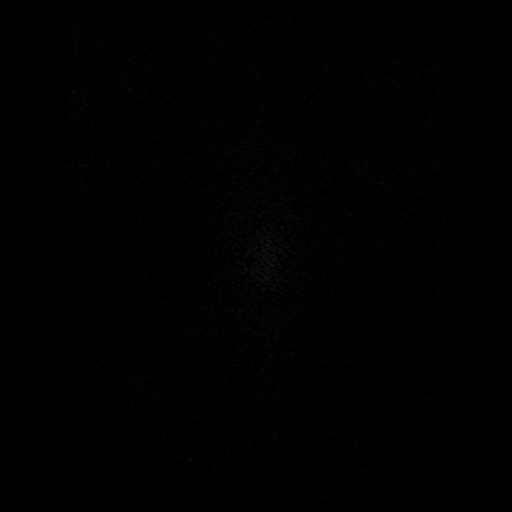

[Series 250: ADC · axial · 3.0mm · 0.94mm/px · z∈[-105,+33]mm · 4 of 50 slices shown (1 of 2)]
[im 1/50]
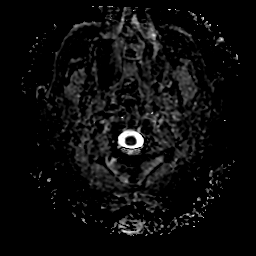
[im 17/50]
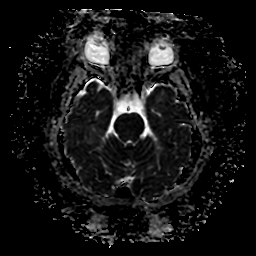
[im 33/50]
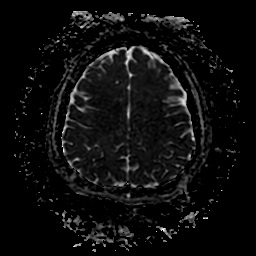
[im 50/50]
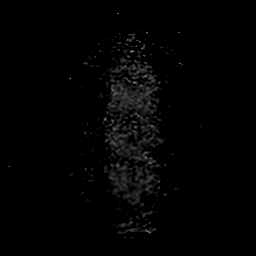

[Series 350: ADC · coronal · 4.0mm · 0.94mm/px · 3 of 34 slices shown (2 of 2)]
[im 1/34]
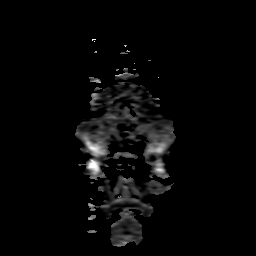
[im 17/34]
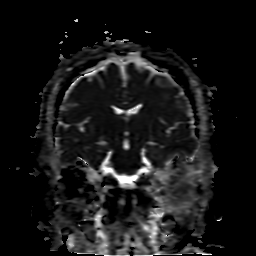
[im 34/34]
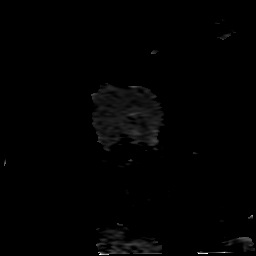

[24 of 48 positions shown; findings below may reference images not displayed]

FINDINGS: Brain: Cerebral volume within normal limits for patient age. No
focal parenchymal signal abnormality identified.

No abnormal foci of restricted diffusion to suggest acute or
subacute ischemia. Gray-white matter differentiation well
maintained. No encephalomalacia to suggest chronic infarction. No
foci of susceptibility artifact to suggest acute or chronic
intracranial hemorrhage.

No mass lesion, midline shift or mass effect. No hydrocephalus. No
extra-axial fluid collection.

Pituitary gland and suprasellar region are normal. Midline
structures intact and normal.

Vascular: Major intracranial vascular flow voids well maintained and
normal in appearance.

Skull and upper cervical spine: Craniocervical junction normal.
Visualized upper cervical spine unremarkable. Bone marrow signal
intensity within normal limits. 1 cm nodule at the scalp vertex
noted, nonspecific, but likely a small sebaceous cyst.

Sinuses/Orbits: Globes and orbital soft tissues within normal
limits.

Mild scattered mucosal thickening noted within the ethmoidal air
cells and maxillary sinuses. Paranasal sinuses are otherwise clear.
No mastoid effusion. Inner ear structures grossly normal.

Other: None.
IMPRESSION: Normal brain MRI.  No acute intracranial abnormality.

## 2020-11-23 IMAGING — MR MR MRA NECK WO/W CM
4 of 7 series · 18 of 48 positions shown · IV contrast (gadavist)
Comparison: Prior CTA from [DATE].

CLINICAL DATA: Initial evaluation for possible vertebral artery
dissection, question on prior study.

EXAM:
MRA NECK WITHOUT AND WITH CONTRAST
TECHNIQUE: Multiplanar and multiecho pulse sequences of the neck were obtained
without and with intravenous contrast. Angiographic images of the
neck were obtained using MRA technique without and with intravenous
contrast.
CONTRAST:  7mL GADAVIST GADOBUTROL 1 MMOL/ML IV SOLN

[Series 7: T1 fat-sat · axial · 3.0mm · 0.47mm/px · z∈[-284,-64]mm · 3 of 65 slices shown]
[im 1/65]
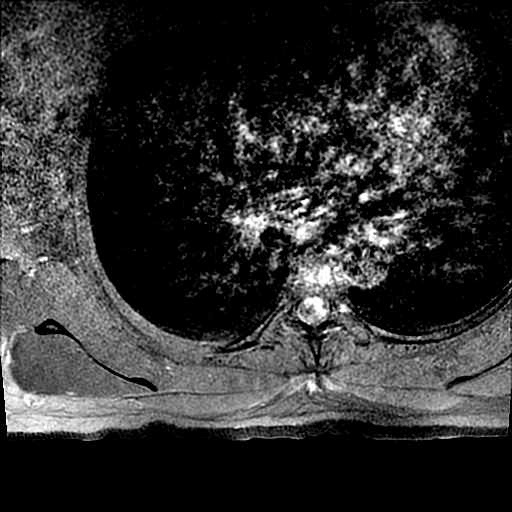
[im 33/65]
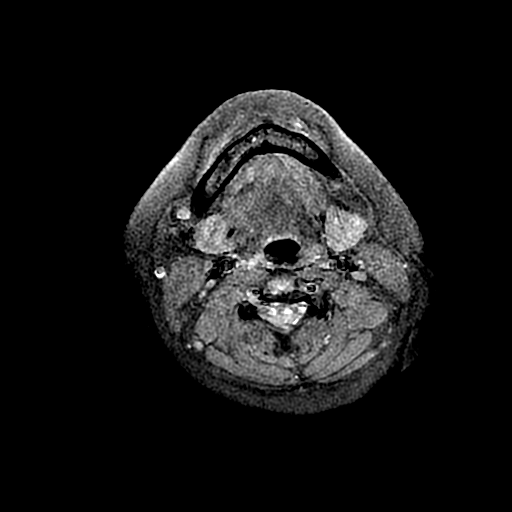
[im 65/65]
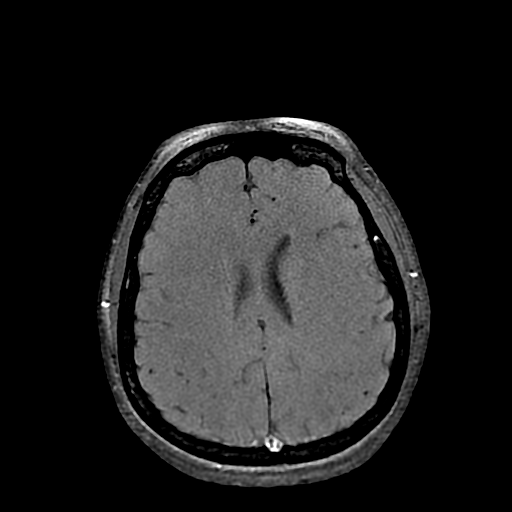

[Series 900: cor cemra ft · coronal · 1.2mm · 0.59mm/px · 7 of 105 slices shown]
[im 1/105]
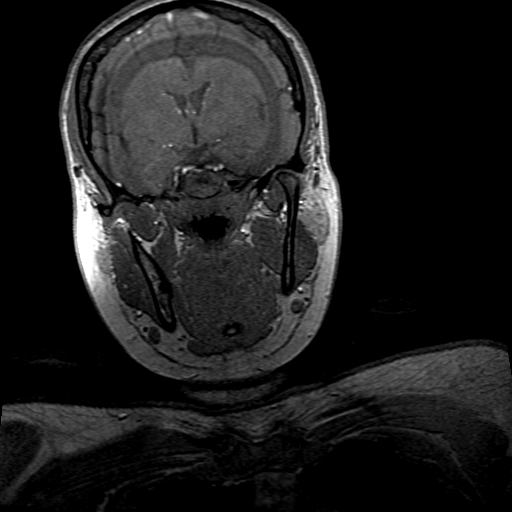
[im 18/105]
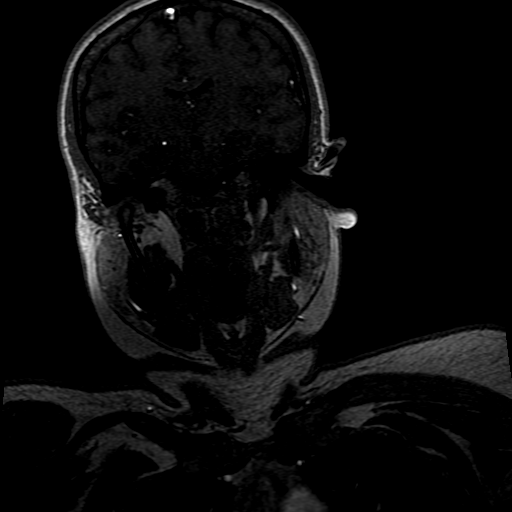
[im 35/105]
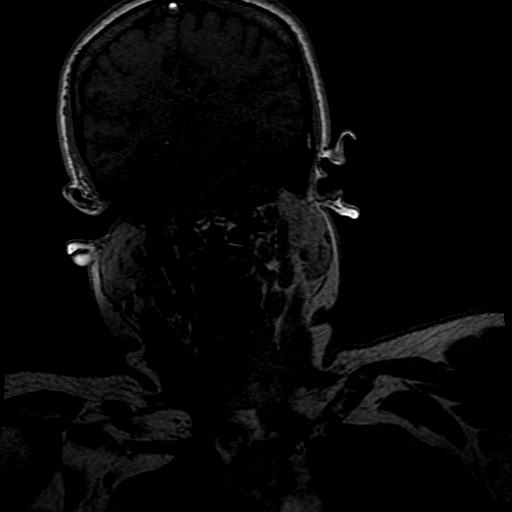
[im 53/105]
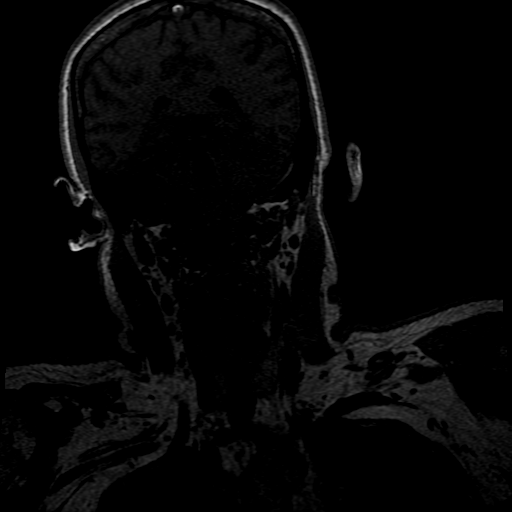
[im 70/105]
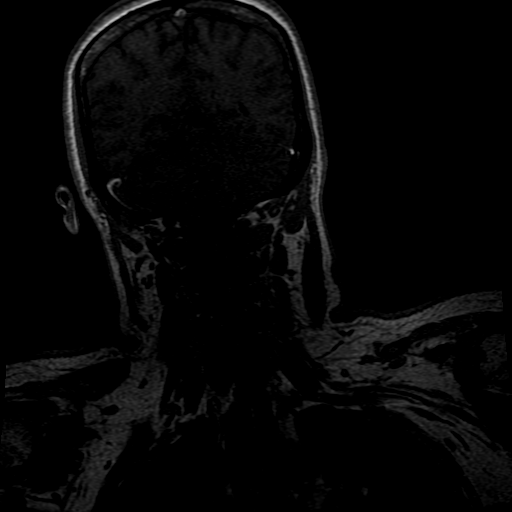
[im 87/105]
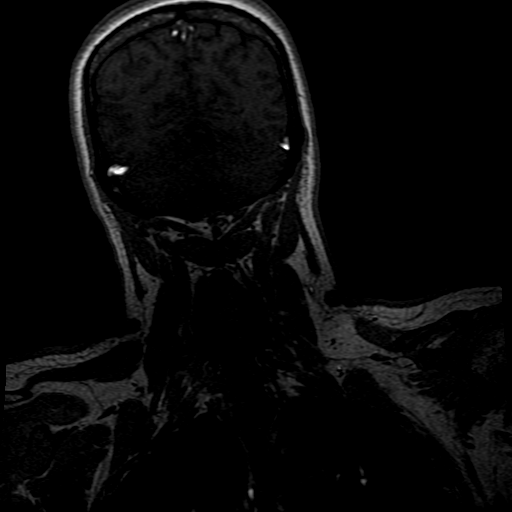
[im 105/105]
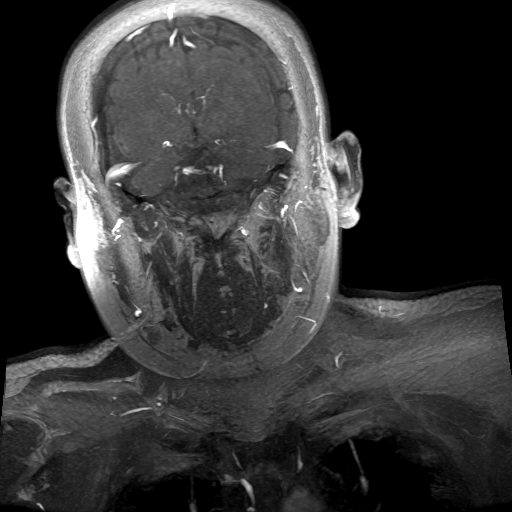

[Series 901: ph1/cor cemra ft · coronal · 1.2mm · 0.59mm/px · 5 of 104 slices shown]
[im 1/104]
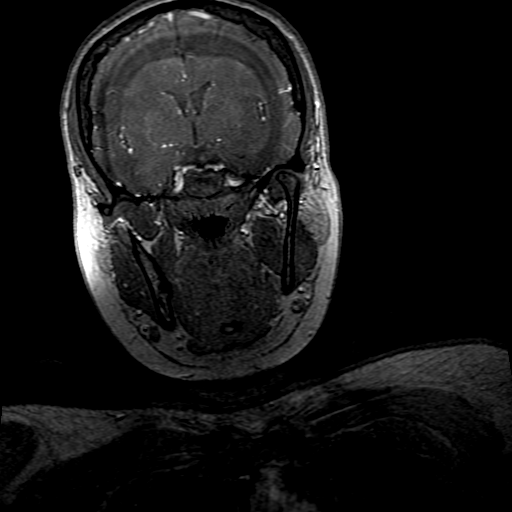
[im 18/104]
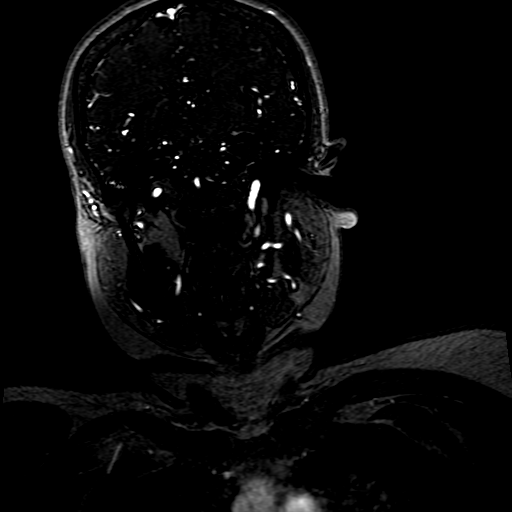
[im 35/104]
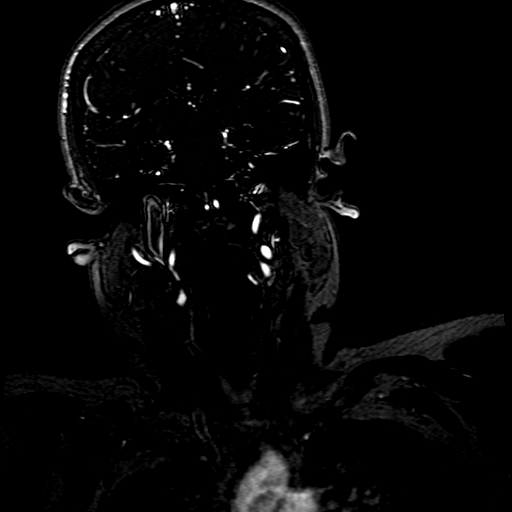
[im 52/104]
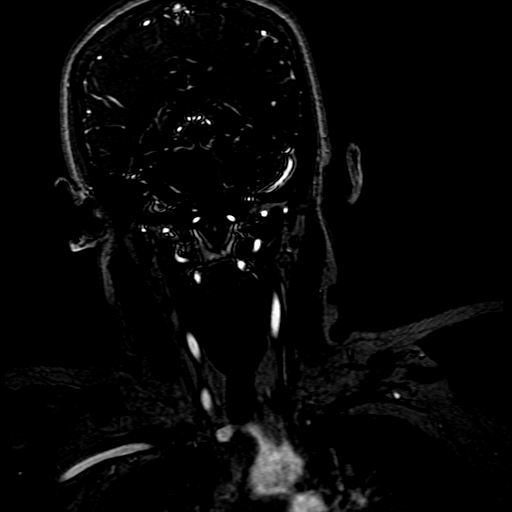
[im 86/104]
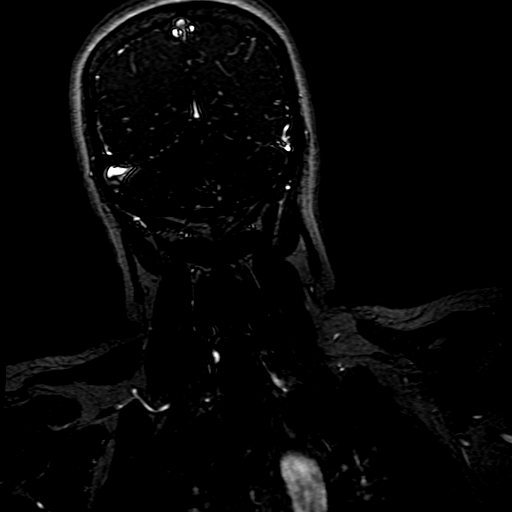

[Series 902: ph2/cor cemra ft · coronal · 1.2mm · 0.59mm/px · 3 of 104 slices shown]
[im 18/104]
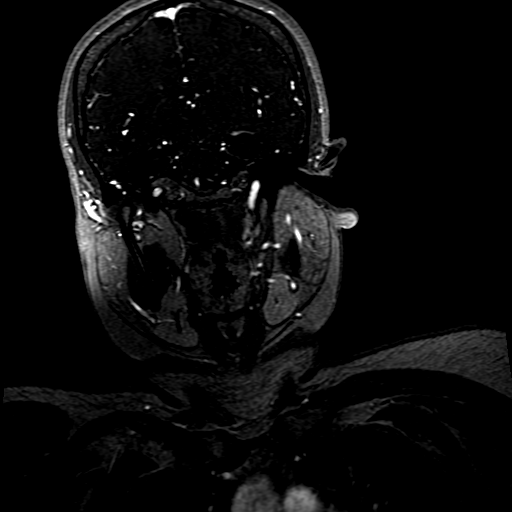
[im 52/104]
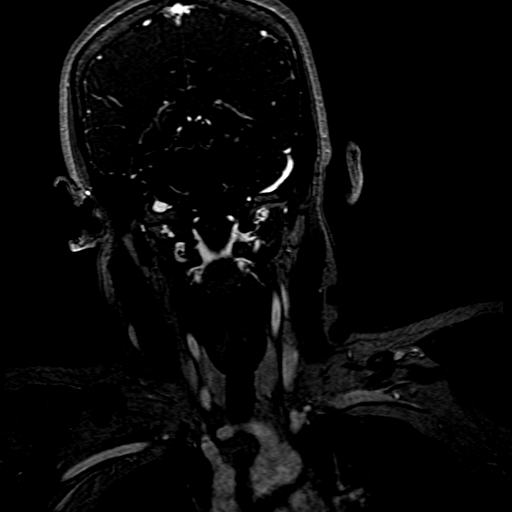
[im 86/104]
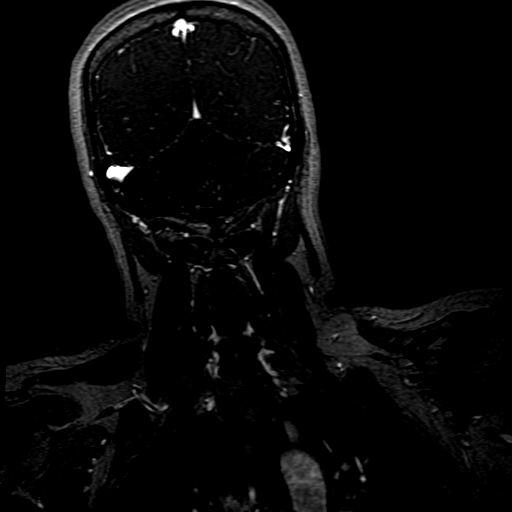

[18 of 48 positions shown; findings below may reference images not displayed]

FINDINGS: AORTIC ARCH: Visualized aortic arch normal in caliber with normal 3
vessel morphology. No hemodynamically significant stenosis or other
abnormality about the origin of the great vessels.

RIGHT CAROTID SYSTEM: Right common and internal carotid arteries
widely patent without stenosis, evidence for dissection, or
occlusion. No significant atheromatous narrowing about the right
carotid bulb or proximal right ICA.

LEFT CAROTID SYSTEM: Left common and internal carotid arteries
patent without stenosis, dissection, or occlusion. No significant
atheromatous narrowing about the left carotid bulb/proximal left
ICA.

VERTEBRAL ARTERIES: Both vertebral arteries arise from the
subclavian arteries. No proximal subclavian artery stenosis. Again
seen is a short segment of mild irregularity involving the proximal
pre foraminal right V1 segment, stable from prior CTA. No
discernible associated T1 signal intensity to suggest dissection or
mural thrombus. This finding may reflect a small vascular
fenestration. Otherwise, vertebral arteries patent within the neck
without stenosis, evidence for dissection or occlusion.
IMPRESSION: 1. Mild focal irregularity about the pre foraminal right V1 segment,
stable from prior CTA. No signal changes to suggest an acute
dissection at this level. Given as, this finding could reflect a
small vascular fenestration, although a chronically healed
dissection remains on the differential.
2. Otherwise unremarkable and normal MRA of the neck. No stenosis or
other acute vascular abnormality.

## 2020-11-23 MED ORDER — GADOBUTROL 1 MMOL/ML IV SOLN: 7.0000 mL | Freq: Once | INTRAVENOUS | Status: DC | PRN

## 2020-11-23 MED ORDER — ASPIRIN EC 81 MG PO TBEC: 81.0000 mg | DELAYED_RELEASE_TABLET | Freq: Every day | ORAL | 11 refills | Status: AC

## 2020-11-23 MED ORDER — GADOBUTROL 1 MMOL/ML IV SOLN
7.0000 mL | Freq: Once | INTRAVENOUS | Status: AC | PRN
  Administered 2020-11-23: 7 mL via INTRAVENOUS

## 2020-11-23 NOTE — ED Notes (Signed)
Patient verbalizes understanding of discharge instructions. Opportunity for questioning and answers were provided. Armband removed by staff, pt discharged from ED via wheelchair to lobby to await ride from family to go home.

## 2020-11-23 NOTE — ED Notes (Signed)
Pt returned from MRI °

## 2020-11-23 NOTE — ED Provider Notes (Signed)
3:24 AM MRI results suggest small vascular fenestration versus chronically healed dissection.  There is no evidence of signal change to suggest an acute dissection at this level.  Imaging findings have been reviewed by Dr. Derry Lory of Neurology who advises d/c on 81mg  ASA QD and outpatient neurology follow up.  Will provide referral to the stroke clinic. VSS. Headache remains resolved. Patient discharged in stable condition.  Vitals:   11/22/20 1346 11/22/20 1840 11/22/20 2321 11/22/20 2350  BP: (!) 151/95 118/73 115/75   Pulse: 84 77 79   Resp: 18 18 16    Temp: 98.7 F (37.1 C)     TempSrc: Oral     SpO2: 100% 100% 99%   Weight:    69.4 kg  Height:    5\' 1"  (1.549 m)      01/23/21, PA-C 11/23/20 0328    Palumbo, April, MD 11/23/20 01/24/21

## 2020-11-23 NOTE — Discharge Instructions (Addendum)
While your head imaging did not show any acute or life-threatening process today, there were some irregularities that warrant follow-up with a neurologist.  You have been given a referral to [facility] for further evaluation.  Make an appointment to be seen in the next 1 to 2 weeks.  Take an 81 mg aspirin daily.  Return to the emergency department for new or concerning symptoms.

## 2020-11-23 NOTE — ED Notes (Signed)
Patient transported to MRI 

## 2022-12-26 NOTE — ED Provider Notes (Signed)
 ------------------------------------------------------------------------------- Attestation signed by Nathen Elsie Bares, MD at 12/28/2022  9:20 AM I evaluated the patient. Neurologic examination by Dr. Rodney was intact. I incorporated historical and physical findings to inform a management plan. I provided direct contemporaneous supervision of the resident physician. I reviewed the resident's documentation and I agree with their findings and plan of care.  -------------------------------------------------------------------------------  [facility] Emergency Department Physician Note   Medical Decision Making   HPI/ROS:  Joy Moore is a 37 y.o. female w/ PMHx headaches presents w/ severe bilateral headache.   History obtained from patient chart review  Patient has had severe headache starting this morning.  Also had associated nausea and vomiting with this headache.  She reports the pain has been bilateral and nonfocal; she characterizes it as pounding.  With the pain she is also experiencing photophobia and sensitivity to noise. She has needed to stay in her room with the lights off away from noise. She has also seen some visual aura with colorful spots, but has not seen a decrease in the acuity of her vision or has diplopia. She is able to ambulate and has not noticed any focal weakness and muscle deficits. She was previously seen at OSH for similar headaches which found some concern for intracranial vessel abnormalities on MRI include V1 segmental irregularity, but these were not thought to be acutely concerning. She also notes a cyst on the crown of her scalp has been more painful over past few weeks.   Physical exam is notable for: -Vital signs within normal limits, afebrile  On my initial evaluation, patient is:  hemodynamically stable in no acute distress normal respiratory effort.   Based on this patient's current presentation, including their  history and physical exam, differential diagnosis includes but is not limited to: migraine headache, cluster headache, tension headache  Overall I have a low level of concern for any acute intracranial bleed.  Patient does have imaging in the past that showed some intracranial vascular abnormalities, however patient does not have focal deficits that would be consistent with an intracranial hemorrhage. At this time, I do not believe head imaging is warranted. Given photophobia, sonophobia, nausea, patient's presentation seems very consistent with migraine, possibly with aura given some of her visual symptoms. When checked again following migraine treatment, patient was greatly improved. Felt ready to go home. Does not have PCP, so I will try to get her a referral.   She does also have cyst on scalp which is very painful. I do not believe it is acutely infected, but will warrant out-patient follow-up with plastics for excision.    Final Clinical Impression: The patient's presentation is most consistent with migraine headache.   Disposition:  DISCHARGE: Patient is felt to be medically appropriate for discharge at this time. Patient was informed of all pertinent physical exam, laboratory, and imaging findings. Patients suspected etiology of their symptom presentation was discussed with the patient and all questions were addressed. Patient was instructed to follow up with their primary care doctor for re-evaluation. Patient was given strict return precautions. The patient agreed with the discharge plan and verbalized understanding of return precautions. The patient was assured that we would be happy to reevaluate them at anytime if they had any concerns about their health regardless of whether it was related to this visit; and, ultimately, discharged in stable condition.   ED Disposition     None       The plan for this patient was discussed with  Dr. Nathen Elsie Bares,* who voiced agreement and  who oversaw evaluation and treatment of this patient.    Due to the patients current presenting symptoms, physical exam findings, and the workup stated above, it is thought that the etiology of the patients current presentation is:  No diagnosis found.   Clinical Complexity A medically appropriate history, review of systems, and physical exam was performed.  Patient's presentation is most consistent with acute, uncomplicated illness  This record was generated with the aid of voice dictation software, and may contain errors. Please contact me for any clarification or with any questions.    HPI/ROS      See MDM  History reviewed. No pertinent past medical history.  Past Surgical History:  Procedure Laterality Date  . TUBAL LIGATION        Physical Exam   Vitals:   12/25/22 2258 12/25/22 2310  BP: 131/80   BP Location: Right arm   Patient Position: Sitting   Pulse: 83   Resp: 14   Temp: 98.2 F (36.8 C)   TempSrc: Oral   SpO2: 99%   Weight:  71.7 kg (158 lb)  Height:  155 cm (5' 1.02)    GEN: NAD, well-appearing  HEENT: MMM, sclera anicteric, NCAT  CARD: RRR, no M/R/G PULM: clear to ascultation bilaterally, normal WOB ABD: non-tender, non-distended, normal bowel sounds EXTR: no rashes/lesions, no LE edema NEURO: Aox3, no focal deficits, cranial nerves normal PSYCH: attentive w/ normal affect  Joy RONAL Apley, MD Internal Medicine HO-1 12/26/2022

## 2023-12-03 ENCOUNTER — Emergency Department (HOSPITAL_COMMUNITY)
Admission: EM | Admit: 2023-12-03 | Discharge: 2023-12-03 | Disposition: A | Discharge status: Home / Self Care | Attending: Emergency Medicine | Admitting: Emergency Medicine

## 2023-12-03 ENCOUNTER — Encounter (HOSPITAL_COMMUNITY): Payer: Self-pay

## 2023-12-03 ENCOUNTER — Emergency Department (HOSPITAL_COMMUNITY)

## 2023-12-03 ENCOUNTER — Other Ambulatory Visit: Payer: Self-pay

## 2023-12-03 DIAGNOSIS — M549 Dorsalgia, unspecified: Secondary | ICD-10-CM | POA: Diagnosis not present

## 2023-12-03 DIAGNOSIS — S2222XA Fracture of body of sternum, initial encounter for closed fracture: Secondary | ICD-10-CM | POA: Insufficient documentation

## 2023-12-03 DIAGNOSIS — R079 Chest pain, unspecified: Secondary | ICD-10-CM | POA: Diagnosis present

## 2023-12-03 DIAGNOSIS — Y92410 Unspecified street and highway as the place of occurrence of the external cause: Secondary | ICD-10-CM | POA: Insufficient documentation

## 2023-12-03 DIAGNOSIS — R109 Unspecified abdominal pain: Secondary | ICD-10-CM | POA: Insufficient documentation

## 2023-12-03 LAB — CBC WITH DIFFERENTIAL/PLATELET
Abs Immature Granulocytes: 0.06 K/uL (ref 0.00–0.07)
Basophils Absolute: 0 K/uL (ref 0.0–0.1)
Basophils Relative: 0 %
Eosinophils Absolute: 0.1 K/uL (ref 0.0–0.5)
Eosinophils Relative: 1 %
HCT: 35.2 % — ABNORMAL LOW (ref 36.0–46.0)
Hemoglobin: 11.9 g/dL — ABNORMAL LOW (ref 12.0–15.0)
Immature Granulocytes: 1 %
Lymphocytes Relative: 23 %
Lymphs Abs: 2.6 K/uL (ref 0.7–4.0)
MCH: 28.5 pg (ref 26.0–34.0)
MCHC: 33.8 g/dL (ref 30.0–36.0)
MCV: 84.2 fL (ref 80.0–100.0)
Monocytes Absolute: 0.6 K/uL (ref 0.1–1.0)
Monocytes Relative: 5 %
Neutro Abs: 7.9 K/uL — ABNORMAL HIGH (ref 1.7–7.7)
Neutrophils Relative %: 70 %
Platelets: 311 K/uL (ref 150–400)
RBC: 4.18 MIL/uL (ref 3.87–5.11)
RDW: 13.7 % (ref 11.5–15.5)
WBC: 11.2 K/uL — ABNORMAL HIGH (ref 4.0–10.5)
nRBC: 0 % (ref 0.0–0.2)

## 2023-12-03 LAB — COMPREHENSIVE METABOLIC PANEL WITH GFR
ALT: 36 U/L (ref 0–44)
AST: 46 U/L — ABNORMAL HIGH (ref 15–41)
Albumin: 3.5 g/dL (ref 3.5–5.0)
Alkaline Phosphatase: 81 U/L (ref 38–126)
Anion gap: 10 (ref 5–15)
BUN: 8 mg/dL (ref 6–20)
CO2: 23 mmol/L (ref 22–32)
Calcium: 8.7 mg/dL — ABNORMAL LOW (ref 8.9–10.3)
Chloride: 104 mmol/L (ref 98–111)
Creatinine, Ser: 0.79 mg/dL (ref 0.44–1.00)
GFR, Estimated: >60 mL/min (ref >60)
Glucose, Bld: 211 mg/dL — ABNORMAL HIGH (ref 70–99)
Potassium: 3.3 mmol/L — ABNORMAL LOW (ref 3.5–5.1)
Sodium: 137 mmol/L (ref 135–145)
Total Bilirubin: 0.3 mg/dL (ref 0.0–1.2)
Total Protein: 6.9 g/dL (ref 6.5–8.1)

## 2023-12-03 LAB — LIPASE, BLOOD: Lipase: 34 U/L (ref 11–51)

## 2023-12-03 LAB — HCG, SERUM, QUALITATIVE: Preg, Serum: NEGATIVE

## 2023-12-03 LAB — TROPONIN I (HIGH SENSITIVITY): Troponin I (High Sensitivity): 4 ng/L (ref <18)

## 2023-12-03 MED ORDER — OXYCODONE HCL 5 MG PO TABS
5.0000 mg | ORAL_TABLET | Freq: Once | ORAL | Status: AC
  Administered 2023-12-03: 5 mg via ORAL
  Filled 2023-12-03: qty 1

## 2023-12-03 MED ORDER — IOHEXOL 350 MG/ML SOLN
75.0000 mL | Freq: Once | INTRAVENOUS | Status: AC | PRN
  Administered 2023-12-03: 75 mL via INTRAVENOUS

## 2023-12-03 MED ORDER — ACETAMINOPHEN 500 MG PO TABS
1000.0000 mg | ORAL_TABLET | Freq: Once | ORAL | Status: AC
  Administered 2023-12-03: 1000 mg via ORAL
  Filled 2023-12-03: qty 2

## 2023-12-03 MED ORDER — CELECOXIB 200 MG PO CAPS: 200.0000 mg | ORAL_CAPSULE | Freq: Two times a day (BID) | ORAL | 0 refills | Status: DC

## 2023-12-03 MED ORDER — CELECOXIB 200 MG PO CAPS: 200.0000 mg | ORAL_CAPSULE | Freq: Two times a day (BID) | ORAL | 0 refills | Status: AC

## 2023-12-03 MED ORDER — KETOROLAC TROMETHAMINE 15 MG/ML IJ SOLN
15.0000 mg | Freq: Once | INTRAMUSCULAR | Status: AC
  Administered 2023-12-03: 15 mg via INTRAVENOUS
  Filled 2023-12-03: qty 1

## 2023-12-03 NOTE — ED Notes (Signed)
 Patient transported to CT
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
